# Patient Record
Sex: Male | Born: 1945 | Race: White | Hispanic: No | Marital: Married | Smoking: Never smoker
Health system: Southern US, Community
[De-identification: ages and names within clinical notes are randomized; demographics above are authoritative.]

## PROBLEM LIST (undated history)

## (undated) HISTORY — PX: CORONARY ANGIOPLASTY WITH STENT PLACEMENT: SHX49

---

## 2019-08-21 ENCOUNTER — Emergency Department (HOSPITAL_COMMUNITY)
Admission: EM | Admit: 2019-08-21 | Discharge: 2019-08-21 | Discharge status: Against Medical Advice | Payer: Medicare HMO | Attending: Emergency Medicine | Admitting: Emergency Medicine

## 2019-08-21 ENCOUNTER — Emergency Department (HOSPITAL_COMMUNITY): Payer: Medicare HMO

## 2019-08-21 ENCOUNTER — Other Ambulatory Visit: Payer: Self-pay

## 2019-08-21 ENCOUNTER — Encounter (HOSPITAL_COMMUNITY): Payer: Self-pay | Admitting: Emergency Medicine

## 2019-08-21 DIAGNOSIS — R55 Syncope and collapse: Secondary | ICD-10-CM

## 2019-08-21 LAB — URINALYSIS, ROUTINE W REFLEX MICROSCOPIC
Bilirubin Urine: NEGATIVE
Glucose, UA: NEGATIVE mg/dL
Hgb urine dipstick: NEGATIVE
Ketones, ur: NEGATIVE mg/dL
Leukocytes,Ua: NEGATIVE
Nitrite: NEGATIVE
Protein, ur: NEGATIVE mg/dL
Specific Gravity, Urine: 1.016 (ref 1.005–1.030)
pH: 5 (ref 5.0–8.0)

## 2019-08-21 LAB — TROPONIN I (HIGH SENSITIVITY): Troponin I (High Sensitivity): 26 ng/L — ABNORMAL HIGH (ref <18)

## 2019-08-21 LAB — CBC
HCT: 42.6 % (ref 39.0–52.0)
Hemoglobin: 14.4 g/dL (ref 13.0–17.0)
MCH: 32.2 pg (ref 26.0–34.0)
MCHC: 33.8 g/dL (ref 30.0–36.0)
MCV: 95.3 fL (ref 80.0–100.0)
Platelets: 134 10*3/uL — ABNORMAL LOW (ref 150–400)
RBC: 4.47 MIL/uL (ref 4.22–5.81)
RDW: 12.3 % (ref 11.5–15.5)
WBC: 6.9 10*3/uL (ref 4.0–10.5)
nRBC: 0 % (ref 0.0–0.2)

## 2019-08-21 LAB — BASIC METABOLIC PANEL
Anion gap: 9 (ref 5–15)
BUN: 17 mg/dL (ref 8–23)
CO2: 23 mmol/L (ref 22–32)
Calcium: 9.2 mg/dL (ref 8.9–10.3)
Chloride: 106 mmol/L (ref 98–111)
Creatinine, Ser: 1.04 mg/dL (ref 0.61–1.24)
GFR calc Af Amer: >60 mL/min (ref >60)
GFR calc non Af Amer: >60 mL/min (ref >60)
Glucose, Bld: 133 mg/dL — ABNORMAL HIGH (ref 70–99)
Potassium: 4.5 mmol/L (ref 3.5–5.1)
Sodium: 138 mmol/L (ref 135–145)

## 2019-08-21 LAB — CBG MONITORING, ED: Glucose-Capillary: 82 mg/dL (ref 70–99)

## 2019-08-21 MED ORDER — SODIUM CHLORIDE 0.9% FLUSH: 3.0000 mL | Freq: Once | INTRAVENOUS | Status: DC

## 2019-08-21 NOTE — Discharge Instructions (Addendum)
Return if any problems.  Contact your cardiologist Monday and let them know what happened

## 2019-08-21 NOTE — ED Notes (Signed)
Pt requesting to leave AMA. Dr. Estell Harpin aware. Pt alert and oriented x4. Verbalized understanding.

## 2019-08-21 NOTE — ED Provider Notes (Signed)
[facility] Provider Note   CSN: [id number] Arrival date & time: 08/21/19  1331     History Chief Complaint  Patient presents with  . Loss of Consciousness    Chris Scott is a 74 y.o. male.  The patient states he went for a run today for about 4 miles and rested at home for an hour.  Then he went to the bathroom to take a shower and he passed out.  Patient did not get dizzy and was not in the shower.  Patient has a history of 1 stent 3 years ago and he has a history of a syncopal episode 7 years ago.  Patient has no complaints now  The history is provided by the patient. No language interpreter was used.  Loss of Consciousness Episode history:  Single Most recent episode:  Today Timing:  Unable to specify Progression:  Resolved Chronicity:  New Context: not blood draw   Witnessed: no   Relieved by:  Nothing Worsened by:  Nothing Ineffective treatments:  None tried Associated symptoms: no anxiety, no chest pain, no headaches and no seizures        History reviewed. No pertinent past medical history.  There are no problems to display for this patient.   Past Surgical History:  Procedure Laterality Date  . CORONARY ANGIOPLASTY WITH STENT PLACEMENT         No family history on file.  Social History   Tobacco Use  . Smoking status: Never Smoker  . Smokeless tobacco: Never Used  Substance Use Topics  . Alcohol use: Not Currently  . Drug use: Never    Home Medications Prior to Admission medications   Not on File    Allergies    Patient has no allergy information on record.  Review of Systems   Review of Systems  Constitutional: Negative for appetite change and fatigue.  HENT: Negative for congestion, ear discharge and sinus pressure.   Eyes: Negative for discharge.  Respiratory: Negative for cough.   Cardiovascular: Positive for syncope. Negative for chest pain.       Syncope  Gastrointestinal: Negative for  abdominal pain and diarrhea.  Genitourinary: Negative for frequency and hematuria.  Musculoskeletal: Negative for back pain.  Skin: Negative for rash.  Neurological: Negative for seizures and headaches.  Psychiatric/Behavioral: Negative for hallucinations.    Physical Exam Updated Vital Signs BP (!) 152/73   Pulse (!) 42   Temp 98.1 F (36.7 C) (Oral)   Resp 15   Ht 5\' 9"  (1.753 m)   Wt 65.8 kg   SpO2 97%   BMI 21.41 kg/m   Physical Exam Vitals and nursing note reviewed.  Constitutional:      Appearance: He is well-developed.  HENT:     Head: Normocephalic.     Nose: Nose normal.  Eyes:     General: No scleral icterus.    Conjunctiva/sclera: Conjunctivae normal.  Neck:     Thyroid: No thyromegaly.  Cardiovascular:     Rate and Rhythm: Regular rhythm.     Heart sounds: No murmur. No friction rub. No gallop.      Comments: Bradycardia Pulmonary:     Breath sounds: No stridor. No wheezing or rales.  Chest:     Chest wall: No tenderness.  Abdominal:     General: There is no distension.     Tenderness: There is no abdominal tenderness. There is no rebound.  Musculoskeletal:  General: Normal range of motion.     Cervical back: Neck supple.  Lymphadenopathy:     Cervical: No cervical adenopathy.  Skin:    Findings: No erythema or rash.  Neurological:     Mental Status: He is alert and oriented to person, place, and time.     Motor: No abnormal muscle tone.     Coordination: Coordination normal.  Psychiatric:        Behavior: Behavior normal.     ED Results / Procedures / Treatments   Labs (all labs ordered are listed, but only abnormal results are displayed) Labs Reviewed  BASIC METABOLIC PANEL - Abnormal; Notable for the following components:      Result Value   Glucose, Bld 133 (*)    All other components within normal limits  CBC - Abnormal; Notable for the following components:   Platelets 134 (*)    All other components within normal limits    TROPONIN I (HIGH SENSITIVITY) - Abnormal; Notable for the following components:   Troponin I (High Sensitivity) 26 (*)    All other components within normal limits  URINALYSIS, ROUTINE W REFLEX MICROSCOPIC  CBG MONITORING, ED  TROPONIN I (HIGH SENSITIVITY)    EKG EKG Interpretation  Date/Time:  Saturday August 21 2019 15:36:49 EST Ventricular Rate:  39 PR Interval:  206 QRS Duration: 105 QT Interval:  512 QTC Calculation: 413 R Axis:   74 Text Interpretation: Sinus bradycardia Prolonged PR interval Borderline ST depression, diffuse leads Confirmed by Bethann Berkshire [phone]) on 08/21/2019 3:40:12 PM   Radiology CT Head Wo Contrast  Result Date: 08/21/2019 CLINICAL DATA:  Headache, head trauma. Syncope. EXAM: CT HEAD WITHOUT CONTRAST TECHNIQUE: Contiguous axial images were obtained from the base of the skull through the vertex without intravenous contrast. COMPARISON:  None. FINDINGS: Brain: No intracranial hemorrhage, mass effect, or midline shift. Brain volume is normal for age. No hydrocephalus. The basilar cisterns are patent. Minimal mineralization in the basal gangliar likely senescent. No evidence of territorial infarct or acute ischemia. No extra-axial or intracranial fluid collection. Vascular: Atherosclerosis of skullbase vasculature without hyperdense vessel or abnormal calcification. Skull: Normal. Negative for fracture or focal lesion. Sinuses/Orbits: Assessed on concurrent face CT, reported separately. Other: None. IMPRESSION: Negative head CT. Electronically Signed   By: Narda Rutherford M.D.   On: 08/21/2019 16:31   CT Maxillofacial Wo Contrast  Result Date: 08/21/2019 CLINICAL DATA:  Facial trauma. Right facial bruising. Syncope today. EXAM: CT MAXILLOFACIAL WITHOUT CONTRAST TECHNIQUE: Multidetector CT imaging of the maxillofacial structures was performed. Multiplanar CT image reconstructions were also generated. COMPARISON:  None. FINDINGS: Osseous: Nasal bone, zygomatic  arches, and mandibles are intact. No acute fracture. Minimal leftward nasal septal deviation. Orbits: No acute orbital fracture. Both orbits and globes are intact. Sinuses: No sinus fracture or fluid level. Paranasal sinuses are clear. No mastoid effusion. Soft tissues: Soft tissue edema involving the right side of the face. Limited intracranial: Negative. Assessed on concurrent head CT. IMPRESSION: Soft tissue edema involving the right side of the face. No facial bone fracture. Electronically Signed   By: Narda Rutherford M.D.   On: 08/21/2019 16:34    Procedures Procedures (including critical care time)  Medications Ordered in ED Medications  sodium chloride flush (NS) 0.9 % injection 3 mL (has no administration in time range)    ED Course  I have reviewed the triage vital signs and the nursing notes.  Pertinent labs & imaging results that were available during my care  of the patient were reviewed by me and considered in my medical decision making (see chart for details).    MDM Rules/Calculators/A&P                      EKG shows bradycardia.  Patient states he is always in bradycardia at around 40.  First troponin was mildly elevated.  Rest of labs are unremarkable.  I spoke with cardiology and it was recommended to admit the patient.  The patient stated he did not want to stay.  He understood the risk and left AMA Final Clinical Impression(s) / ED Diagnoses Final diagnoses:  Syncope and collapse    Rx / DC Orders ED Discharge Orders    None       Bethann Berkshire, MD 08/21/19 1905

## 2019-08-21 NOTE — ED Triage Notes (Signed)
Pt ran 4 miles today, came back in and looked at phone for 1 hour, and had syncopal episode when he went to get in shower.  Wife found him in floor and he states per his wife he was only unresponsive for approx 30 seconds.  Pt has mild pain and bruising to R side of face from fall.  Denies any other injuries. Denies dizziness.

## 2019-09-14 NOTE — Progress Notes (Signed)
 "   Medicare Subsequent AWV   Chris Scott is a 74 y.o. male who presents for his subsequent annual wellness visit for Medicare.  Any physical exam components or additional concerns beyond the scope of the Annual Wellness Visit may be documented in a separate note within this encounter.  Health Risk Assessment   Current Living Arrangement: Spouse/Significant Other During the past four weeks, how much pain in your body have you had on a scale of 0-10?: Very mild pain (1-2) During the past four weeks, was someone available to help you if you needed and wanted help?: Yes, as much as I wanted During the past four weeks, what was the hardest level of physical activity you could do for at least two minutes?: Heavy Each night, how many hours of sleep do you usually get?: 7 Do you snore or has anyone told you that you snore?: No Do you always fasten your seat belt when you are in a car?: Yes Do you drive after drinking alcohol or ride with a driver who has been drinking?: No How often during the past four weeks have you been bothered by falling or dizziness when standing up?: Seldom How often during the past four weeks have you been bothered by sexual problems?: Never Do any of the following describe you?  Multiple sex partners and/or intercourse with partner of the same sex: No How often during the past four weeks have you been bothered by teeth or denture problems?: Never How often during the past four weeks have you been bothered by tiredness or fatigue?: (!) Sometimes During the past four weeks, how would you rate your health in general?: Very good What is your race? (Check all that apply): White   Depression Screening   Depression Screen 09/14/2019 08/05/2019 07/14/2017  Please choose the category that best describes the patient's current state 0 0 0  Not eligible on the basis of Not applicable Not applicable Not applicable  1. Little interest or pleasure in doing things 0 0 0  2. Feeling  down, depressed, or hopeless 0 0 0  PHQ-2 Total Score 0 0 0  PHQ-2 Positive/Negative Screening for Depression Negative Negative -  Some recent data might be hidden     Cognitive and Functional Assessments   Is the person deaf or does he/she have serious difficulty hearing?: No Is this person blind or does he/she have serious difficulty seeing even when wearing glasses?: No Do you/patient have serious difficulty concentrating, remembering, or making decisions?: No Have you had any concerns about changes in your memory or concentration?: No  ADL Assessment   Please select any of the following that the person has serious difficulty managing on their own:: none apply Please select any of the following that the person has serious difficulty managing on their own:: none apply   Social Determinants of Health (SDoH) and Personal Data   On average, how many days per week do you engage in moderate to strenuous exercise (like a brisk walk)?: 6 days On average, how many minutes do you engage in exercise at this level?: 50 min How hard is it for you to pay for the very basics like food, housing, medical care, and heating?: Not hard at all In the last 12 months, was there a time when you were not able to pay the mortgage or rent on time?: Yes In the last 12 months, how many places have you lived?: 1 In the last 12 months, was there a time  when you did not have a steady place to sleep or slept in a shelter (including now)?: No In the past 12 months, has lack of transportation kept you from medical appointments or from getting medications?: No In the past 12 months, has lack of transportation kept you from meetings, work, or from getting things needed for daily living?: No Within the past 12 months, you worried that your food would run out before you got the money to buy more.: Never true Within the past 12 months, the food you bought just didnt last and you didnt have money to get more.: Never  true Do you feel stress - tense, restless, nervous, or anxious, or unable to sleep at night because your mind is troubled all the time - these days?: Only a little In a typical week, how many times do you talk on the phone with family, friends, or neighbors?: Once a week How often do you get together with friends or relatives?: Once a week How often do you attend church or religious services?: More than 4 times per year Do you belong to any clubs or organizations such as church groups, unions, fraternal or athletic groups, or school groups?: Yes How often do you attend meetings of the clubs or organizations you belong to?: Never Are you married, widowed, divorced, separated, never married, or living with a partner?: Married How often do you have a drink containing alcohol?: Monthly or less How many drinks containing alcohol do you have on a typical day when you are drinking?: 1 or 2 How often do you have six or more drinks on one occasion?: Never Do you depend on people living with you for personal care?: No Are there any cultural or religious beliefs that your healthcare provider should be aware of that would be helpful in your health care? : No  Advance Care Directives   Do you have a living will?: Yes Do you have a Healthcare Power of Attorney?: Yes Who is your Healthcare Power of Attorney?: wife Chris Scott Risk Screening   Fall Risk Category: High Risk   Patient can ambulate: Yes In the last year, have you had 2 or more falls, trips or stumbles where you landed on the ground?: No In the last year, have you had 1 or more falls, trips or stumbles where you hurt yourself?: (!) Yes Do you feel you could benefit from installing grab bars on your tub and/or shower?: No Do you use scatter rugs throughout your home?: (!) Yes Get dizzy or lightheaded when you change positions?: (!) Yes Problems walking for any reason?: No Vision Problems?: (!) Yes Do you use anything or anyone to help  you walk?: No  Intervention: Lifestyle modifications and Medications reviewed and/or modified  Medicare Required Components    Social History section has been updated:: Yes(no changes) The following additional chart sections were updated:: Past Medical History, Past Surgical History, Medications, Allergies, Family History(no changes)  Cognitive screen indicated?: No Based on my direct observation, with due consideration of information obtained via beneficiary reports and communication with family members/care takers, further cognitive assessment is not indicated.  Dietary issues addressed:: No HRA completed and reviewed:: Yes Care Team updated:: Yes(no changes) Advance care directives discussed and information provided if necessary:: No  Patient Care Team: [name], MD as PCP - General (Family Medicine) Devona Frutoso Hagedorn, MD as Consulting Physician (Cardiovascular Disease)  Vitals    Vitals:   09/14/19 1519  BP: 132/84  Patient Position:  Sitting  Pulse: (!) 49  Temp: 97.2 F (36.2 C)  TempSrc: Skin  Height: 5' 9 (1.753 m)  Weight: 151 lb 9.6 oz (68.8 kg)  SpO2: 98%  BMI (Calculated): 22.4    Disposition   1. Medicare annual wellness visit, subsequent (Primary) 2. Annual physical exam 3. Mixed hyperlipidemia 4. Coronary artery disease involving native coronary artery of native heart without angina pectoris 5. Sinus bradycardia 6. Colon cancer screening -     Cologuard Stool    Follow up in about 1 year (around 09/13/2020) for Annual wellness visit.   Health maintenance issues including appropriate cancer screening, annual eye exam, healthy diet, exercise and tobacco avoidance were discussed with the patient.  A written plan was provided to the patient in the form of patient instructions in the after visit summary.   "

## 2019-09-24 NOTE — Nursing Note (Signed)
 Pt and wife provided written and verbal instructions including NPO status after MN on 09/28/19, medications to take/hold the morning of the procedure, fall precautions related to anesthesia, infections prevention including bathing x2 with CHG soap the night prior to and the morning of the procedure and arrival time of 0830 on 10/02/18. Surgical prep with clippers done. All questions answered and both voiced understanding to all instructions.

## 2019-09-24 NOTE — H&P (Signed)
 ------------------------------------------------------------------------------- Attestation signed by Sherron DELENA Champ, MD at 09/27/19 1201 I certify that I have reviewed the above note entered by the PA/NP/student.  I have participated in the formulation of the plan and have reviewed the anticipated medical care.  I agree with the decision making and the care plans as indicated in the note.  -------------------------------------------------------------------------------   PREP FOR CASE - I have not met nor completed a physical exam for this patient.  I have not developed a treatment plan nor made recommendations for this patient's procedure.  Sherron DELENA Champ, MD  Physician  Specialty:  Cardiology  Progress Notes     Signed  Encounter Date:  09/07/2019          Signed                                    Referring Provider No att. providers found   Reason for consult:  1. Sinus bradycardia   2. Coronary artery disease involving native coronary artery of native heart without angina pectoris   3. Hearing loss, unspecified hearing loss type, unspecified laterality       Chronic Problem List:     Patient Active Problem List  Diagnosis   Shoulder pain, left   Coronary artery disease involving native coronary artery of native heart without angina pectoris   Hearing loss   Sinus bradycardia   Mixed hyperlipidemia      History of Present Illness Chris Scott is 74 y.o. year old male with a history of coronary artery disease status post stenting who is also a long-term runner who presents to [facility] health cardiology EP clinic for the evaluation of syncope. This patient reports that for syncopal episode approximately 7 years ago and had none until several weeks ago. He has had 2 syncopal events that occur seemingly the same way. He continues to run and ran for about 40 minutes. After running he felt well relaxed and sat down in a chair and then upon  rising felt quite dizzy and lightheaded and found himself on the floor. The the other episode was quite similar and was post exercise. A Holter monitor was applied and it demonstrates a slowest heart rate of 28 bpm during sleep. He also has some profound sinus bradycardia with rates less than 40 bpm at 9:00 in the morning. 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 Given his prior to syncopal episodes and bradycardia that has been monitored on the Holter I think it seems reasonable to place a pacemaker. I reassured him that in the long-term after the pacemaker is healed he can return to exercise. He should avoid running and driving an automobile until such time the decision about the device is made. He will complete his stress echocardiogram to exclude recurrent coronary disease before the pacemaker is placed.   Past Medical History     Past Medical History:  Diagnosis Date   Coronary artery disease        Past Surgical History      Past Surgical History:  Procedure Laterality Date   Cardiac surgery       Elbow surgery       Tonsillectomy          Social History Patient's  Family History Patient's family history includes Arrhythmia in his mother and sister; Arthritis in his brother; Diabetes in his father and mother; Heart disease in his mother; Hypertension in his mother.   Allergies Patient has no known allergies.   Medications   Current Outpatient Medications:    aspirin 81 mg tablet, Take 81 mg by mouth daily., Disp: , Rfl:    atorvastatin (LIPITOR) 80 mg tablet, TAKE 1 TABLET BY MOUTH EVERY DAY, Disp: 90 tablet, Rfl: 3   clopidogrel bisulfate (PLAVIX) 75 mg tablet, Take one tablet (75 mg dose) by mouth daily., Disp: 90 tablet, Rfl: 1   Review of Systems A comprehensive review of systems was  negative.   Physical Examination Vitals: Blood pressure 122/80, pulse (!) 41, temperature 97.9 F (36.6 C), resp. rate 16, height 5' 9 (1.753 m), weight 148 lb (67.1 kg), SpO2 98 %. General:  Pleasant, cooperative, and in no acute distress.  Alert and oriented. HEENT:  Sclera clear, anicteric.  No thyromegaly or lymphadenopathy noted. Cardiovascular:  S1, S2 normal, no murmur, rub or gallop, regular rate and rhythm. Carotid upstokes are normal and without bruits.  The peripheral pulses are palpable and symmetric. Lungs: chest clear, no wheezing, rales, normal symmetric air entry, Heart exam - S1, S2 normal, no murmur, no gallop, rate regular Abdomen:  abdomen is soft without significant tenderness, masses, organomegaly or guarding.  No masses, bruits, or hepatosplenomegaly noted. Skin:  Normal texture with no significant lesions. Extremities:  No significant clubbing or cyanosis. No  edema Psychiatric:  Normal affect.  Mood within normal limits. Neurologic:  Grossly nonfocal.   Accessory Clinical Data        Lab Results  Component Value Date    Glucose 93 07/23/2018    CALCIUM 9.8 07/23/2018    Sodium 146 (H) 07/23/2018    Potassium 4.5 08/06/2019    CO2 23 07/23/2018    Chloride 108 (H) 07/23/2018    BUN 19 07/23/2018    Creatinine 1.10 07/23/2018         Lab Results  Component Value Date    WBC 7.0 07/21/2018    Hemoglobin 15.1 07/21/2018    Hematocrit 44.7 07/21/2018    MCV 95 07/21/2018    Platelet Count 168 07/21/2018         Lab Results  Component Value Date    CK-MB 3.2 04/14/2012    CK 91 04/14/2012    Troponin T <0.010 04/14/2012    No results found for: INR, PROTIME   Impression History of coronary artery disease with remote stenting Recurrent syncope x2 Documented sinus node dysfunction with profound sinus bradycardia         Plan I believe he meets qualifications for dual-chamber pacemaker placement All the risk associated with with the device  placement were discussed with the patient and his wife in detail they will think about it and let us  know their decision He will complete his stress echocardiogram         Thank you for allowing us  to participate in the care of this patient.              Office Visit on 09/07/2019 <redacted file path>   Detailed Report <redacted file path>  Note shared with patient

## 2019-09-24 NOTE — ACP (Advance Care Planning) (Signed)
 [facility]  Advance Care Planning   Patient:   Chris Scott MR Number:  [id number] Patient Date of Birth: May 12, 1946 Age/Sex:  74 y.o./male      Discussion Date: 09/24/2019 Discussion Focus: Goals of Care  Health Care Agents    Fransisco, Messmer - Spouse Not Active  Primary Phone: [phone] (Mobile)         Is agent appointed in legal document?: Yes  Discussion Participants: Patient  Summary of voluntary conversation : requested copies of advance directives.   Patient's Expressed Goals of Health Care/Life Goals: Improve maintain function/quality of life  Electronically signed: Shanda Holmes, RN 09/24/2019 / 1:35 PM

## 2019-09-28 NOTE — H&P (Signed)
 [name] 09/07/2019 2:30 PM  Office Visit MRN:  [id number] Description: Male DOB: [birth year] Provider: Sherron DELENA Champ, MD Department: [facility] Encounter #: [id number]  Referring Provider  [name], MD      Diagnoses  Sinus bradycardia - Primary  Codes: R00.1  Coronary artery disease involving native coronary artery of native heart without angina pectoris   Codes: I25.10  Hearing loss, unspecified hearing loss type, unspecified laterality   Codes: H91.90       Reason for Visit  Reason for Visit History <redacted file path>    Progress Notes  Sherron DELENA Champ, MD at 09/07/2019 3:17 PM  Status: Signed    Referring Provider No att. providers found  Reason for consult:  1. Sinus bradycardia   2. Coronary artery disease involving native coronary artery of native heart without angina pectoris   3. 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 year old male with a history of coronary artery disease status post stenting who is also a long-term runner who presents to [facility] health cardiology EP clinic for the evaluation of syncope. This patient reports that for syncopal episode approximately 7 years ago and had none until several weeks ago. He has had 2 syncopal events that occur seemingly the same way. He continues to run and ran for about 40 minutes. After running he felt well relaxed and sat down in a chair and then upon rising felt quite dizzy and lightheaded and found himself on the floor. The the other episode was quite similar and was post exercise. A Holter monitor was applied  and it demonstrates a slowest heart rate of 28 bpm during sleep. He also has some profound sinus bradycardia with rates less than 40 bpm at 9:00 in the morning. 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 Given his prior to syncopal episodes and bradycardia that has been monitored on the Holter I think it seems reasonable to place a pacemaker. I reassured him that in the long-term after the pacemaker is healed he can return to exercise. He should avoid running and driving an automobile until such time the decision about the device is made. He will complete his stress echocardiogram to exclude recurrent coronary disease before the pacemaker is placed.  Past Medical History     Past Medical History:  Diagnosis Date   Coronary artery disease     Past Surgical History      Past Surgical History:  Procedure Laterality Date   Cardiac surgery     Elbow surgery     Tonsillectomy      Social History Patient's   Family History Patient's family history includes Arrhythmia in his mother and sister; Arthritis in his brother; Diabetes in his father and mother; Heart disease in his mother; Hypertension in his mother.  Allergies Patient has no known allergies.  Medications  Current Outpatient Medications:    aspirin 81 mg tablet, Take  81 mg by mouth daily., Disp: , Rfl:    atorvastatin (LIPITOR) 80 mg tablet, TAKE 1 TABLET BY MOUTH EVERY DAY, Disp: 90 tablet, Rfl: 3   clopidogrel bisulfate (PLAVIX) 75 mg tablet, Take one tablet (75 mg dose) by mouth daily., Disp: 90 tablet, Rfl: 1  Review of Systems A comprehensive review of systems was negative.  Physical Examination Vitals: Blood pressure 122/80, pulse (!) 41, temperature 97.9 F (36.6 C), resp. rate 16, height 5' 9 (1.753 m), weight 148 lb  (67.1 kg), SpO2 98 %. General:  Pleasant, cooperative, and in no acute distress.  Alert and oriented. HEENT:  Sclera clear, anicteric.  No thyromegaly or lymphadenopathy noted. Cardiovascular:  S1, S2 normal, no murmur, rub or gallop, regular rate and rhythm. Carotid upstokes are normal and without bruits.  The peripheral pulses are palpable and symmetric. Lungs: chest clear, no wheezing, rales, normal symmetric air entry, Heart exam - S1, S2 normal, no murmur, no gallop, rate regular Abdomen:  abdomen is soft without significant tenderness, masses, organomegaly or guarding.  No masses, bruits, or hepatosplenomegaly noted. Skin:  Normal texture with no significant lesions. Extremities:  No significant clubbing or cyanosis. No  edema Psychiatric:  Normal affect.  Mood within normal limits. Neurologic:  Grossly nonfocal.  Accessory Clinical Data       Lab Results  Component Value Date   Glucose 93 07/23/2018   CALCIUM 9.8 07/23/2018   Sodium 146 (H) 07/23/2018   Potassium 4.5 08/06/2019   CO2 23 07/23/2018   Chloride 108 (H) 07/23/2018   BUN 19 07/23/2018   Creatinine 1.10 07/23/2018        Lab Results  Component Value Date   WBC 7.0 07/21/2018   Hemoglobin 15.1 07/21/2018   Hematocrit 44.7 07/21/2018   MCV 95 07/21/2018   Platelet Count 168 07/21/2018        Lab Results  Component Value Date   CK-MB 3.2 04/14/2012   CK 91 04/14/2012   Troponin T <0.010 04/14/2012   No results found for: INR, PROTIME  Impression History of coronary artery disease with remote stenting Recurrent syncope x2 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       Electronically signed by Sherron DELENA Champ, MD at 09/07/2019 3:21 PM  Questionnaires  No completed forms available for this encounter.  Vital Signs Most recent update: 09/07/2019 2:46 PM BP  122/80     Pulse  41      Temp  97.9 F (36.6 C)     Resp  16     Ht  5' 9 (1.753 m)      Wt  148 lb (67.1 kg)   SpO2  98%   BMI  21.86 kg/m    Orders Placed This Encounter

## 2019-09-29 NOTE — Interval H&P Note (Signed)
 H&P reviewed, patient examined and there is NO CHANGE to the patient status.

## 2019-09-29 NOTE — Discharge Summary (Signed)
 ------------------------------------------------------------------------------- Attestation signed by Sherron DELENA Champ, MD at 09/30/19 1309 I certify that I have reviewed the above note entered by the PA/NP/student.  I have participated in the formulation of the plan and have reviewed the anticipated medical care.  I agree with the decision making and the care plans as indicated in the note.  -------------------------------------------------------------------------------  [facility] Discharge Summary  PCP: Chris JAYSON Reilly, MD Discharge Details   Admit date:         09/29/2019 Discharge date:        09/30/2019   Active Hospital Problems   Diagnosis Date Noted POA   *Sinus bradycardia 08/05/2019 Unknown    Resolved Hospital Problems  No resolved problems to display.    Current Discharge Medication List    CONTINUED medications   Details  aspirin 81 mg tablet Take 81 mg by mouth daily.    atorvastatin (LIPITOR) 80 mg tablet TAKE 1 TABLET BY MOUTH EVERY DAY    clopidogrel bisulfate (PLAVIX) 75 mg tablet Take one tablet (75 mg dose) by mouth daily.          Hospital Course  Physicians involved in care during this hospitalization Attending Provider: Sherron DELENA Champ, MD Admitting Provider: Sherron DELENA Champ, MD Anesthesiologist: Marius Marius Farr, MD  Indication for Admission:   [facility] Course:    Chris Scott is a 74 yo male presented with multiple syncopal events and found to have profound bradycardia after wearing a Holter monitor revealing rates less than 40 bpm in the mornings.  Patient presented as an OPD to undergo PPM implantation for SSS.    Had a successful left sided dual chamber PPM on 09/29/2019.  Incision site w/o hematoma. CXR with stable lead placement and device interrogation revealed a properly functioning device.    Patient discharged in stable condition.  Vitals signs stable and remaining physical examination unremarkable.    Discharge instructions were reviewed at bedside, questions were answered to his satisfaction and had no further questions upon my departure.    Follow up with nurse in one week to assess wound and 6 week device interrogation in 6 weeks.  Maintain FU appt as scheduled below with primary electrophysiologist.     Procedure(s) (LRB): EP Pacemaker Insertion (Dual Chamber) (Left)  09/29/2019  Surgeon(s): Sherron DELENA Champ, MD -------------------  [facility]   Diet Instructions    Regular Diet       Other Instructions    Ambulatory referral to Cardiology     Reason for referral: FU in [place] for a 1 week nurse assessment of wound device implant   Referral Type: Consultation   Evaluate and Return   Notify Physician for increased pain at the operative site (or unrelieved pain)       Contact information for follow-up          Ambulatory referral to Cardiology      Next Steps: Follow up   Instructions: FU in [place] for a 1 week nurse assessment of wound device implant   Chris JAYSON Reilly, MD  Specialty: Family Medicine  Relationship: PCP - General   [address] [place] [address]  Phone: [phone]     Next Steps: Follow up      Appointments which have been scheduled   Oct 19, 2019  2:15 PM Follow Up Appointment with Sherron DELENA Champ, MD [facility] (--) [address] [place] [address] [phone]  Apr 05, 2020  9:00 AM Office Visit with Chris DELENA Piety, NP [facility] (--) [address] [place] [address] [phone]      Time spent in discharge process:   45 minutes   Electronically signed: Corean DELENA Savannah, NP 09/30/2019 / 10:45 AM

## 2019-09-29 NOTE — Anesthesia Postprocedure Evaluation (Signed)
" °  Patient: Chris Scott Procedure(s): EP Pacemaker Insertion (Dual Chamber) Anesthesia type: general  Patient location:  PACU Patient participation:  Patient able to participate in this evaluation at age appropriate level.  Vital signs reviewed and can be found in nursing documentation.   Post vital signs:   stable Level of consciousness:   awake, alert and oriented  Post-anesthesia pain:   adequate analgesia Airway patency:   patent Respiratory:   unassisted, respiration function adequate, spontaneous ventilation Cardiovascular:   stable, blood pressure acceptable and heart rate acceptable Hydration:   adequate hydration Temperature: temperature adequate >96.32F PONV:  nausea and vomiting controlled Regional anesthesia: no block performed  Anesthetic complications:   no "

## 2019-09-29 NOTE — Nursing Note (Addendum)
 Pt received to room 6145. Hr 69. Sinus rhythm, 1 st degree heart block. A paced. wife at bedside. Incision to lt shoulder clean dry and intact with skin glue and sling in place.

## 2019-09-30 NOTE — Nursing Note (Signed)
 Discharge instructions have been reviewed with the patient/support person. The patient/support person has been provided a copy of the discharge instructions.  The patient/support person has been given the opportunity for a demonstration of specific follow-up care tasks. RN provided education on pacemaker. Pt will be discharged to discharge lounge, wife present and will be taking pt home.

## 2019-09-30 NOTE — Nursing Note (Signed)
 AVS, discharge instructions , S&S of infection, and medications reviewed with pt he voiced understanding denied questions or  Concerns AVS returned to pt after review  To discharge bay C via WC with nurse. Transported home in private vehicle with wife @ 1145

## 2019-10-08 NOTE — Progress Notes (Signed)
 Vitals:   10/08/19 0950  BP: (!) 146/92  Pulse: 77  Temp: 97.9 F (36.6 C)  SpO2: 97%     Pt seen today for initial assessment of wound post pacemaker placement to left chest wall.  No redness or drainage. Patient does have some swelling and bruising. Dermabond intact.  Discharge instructions and S&S of infection reviewed with pt.    Questions were answered and pt voiced understanding. Patient to return for f/u and device check in 2-3 weeks with Dr. Lilian.

## 2019-10-23 LAB — COLOGUARD: COLOGUARD: NEGATIVE

## 2020-10-29 IMAGING — CT CT MAXILLOFACIAL W/O CM
4 of 8 series · 16 of 47 positions shown, 19 images · non-contrast
Comparison: None.

CLINICAL DATA: Facial trauma. Right facial bruising. Syncope today.

EXAM:
CT MAXILLOFACIAL WITHOUT CONTRAST
TECHNIQUE: Multidetector CT imaging of the maxillofacial structures was
performed. Multiplanar CT image reconstructions were also generated.

[Series 4: st thins (person_name) · axial · 0.37mm/px · z∈[-203,-74]mm · 9 of 231 slices shown, 12 images]
[im 24/231  brain]
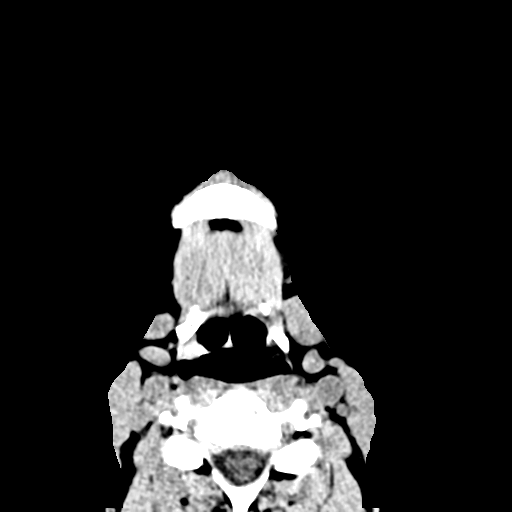
[im 24/231  bone]
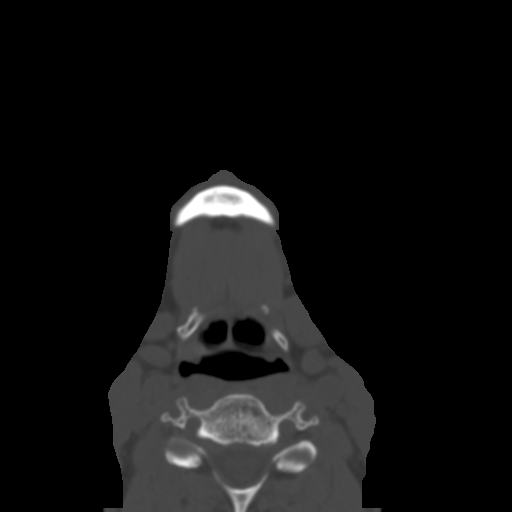
[im 47/231  bone]
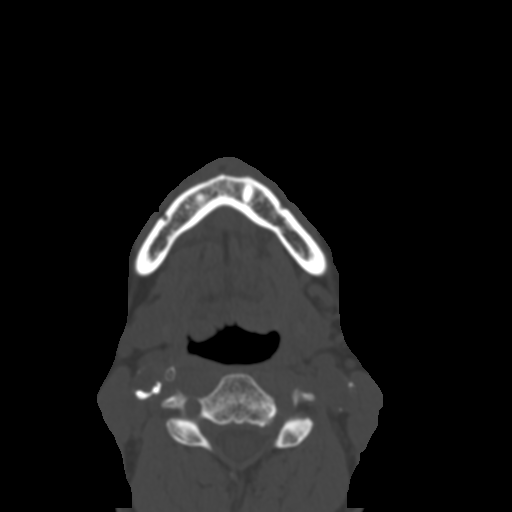
[im 70/231  bone]
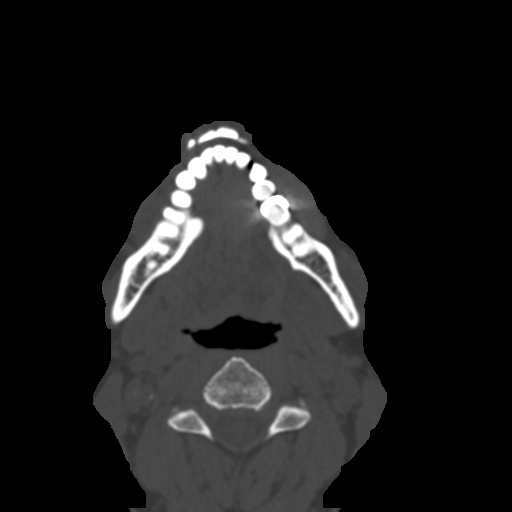
[im 93/231  bone]
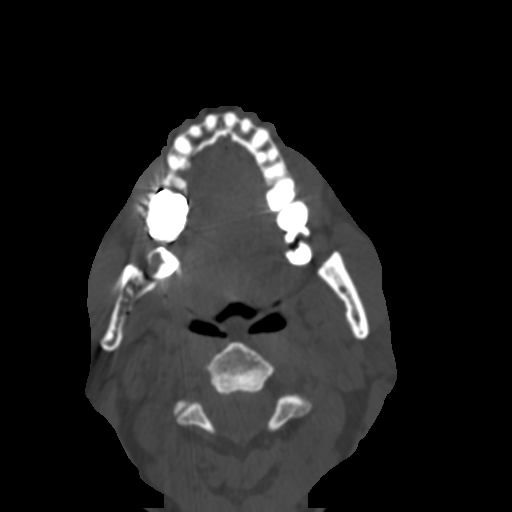
[im 116/231  brain]
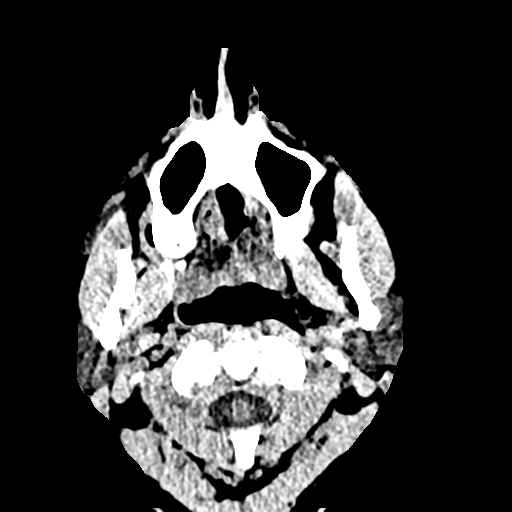
[im 116/231  bone]
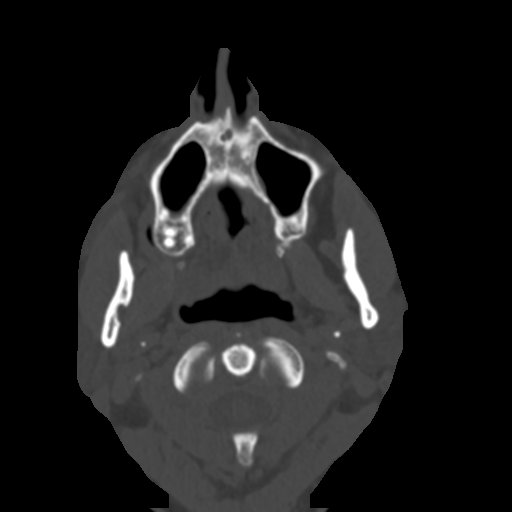
[im 139/231  bone]
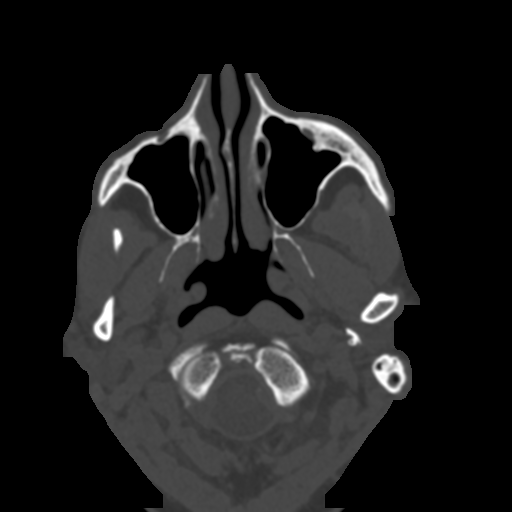
[im 162/231  bone]
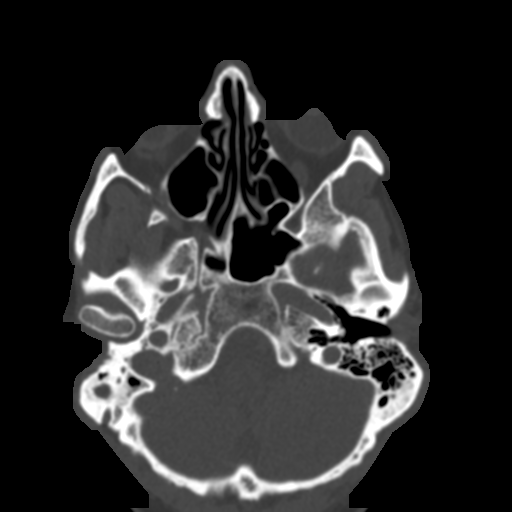
[im 185/231  bone]
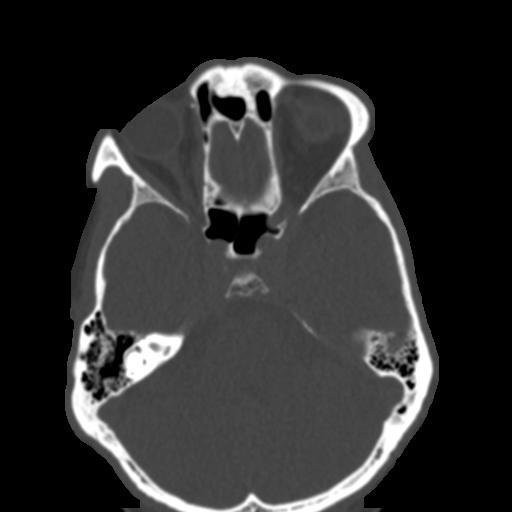
[im 208/231  brain]
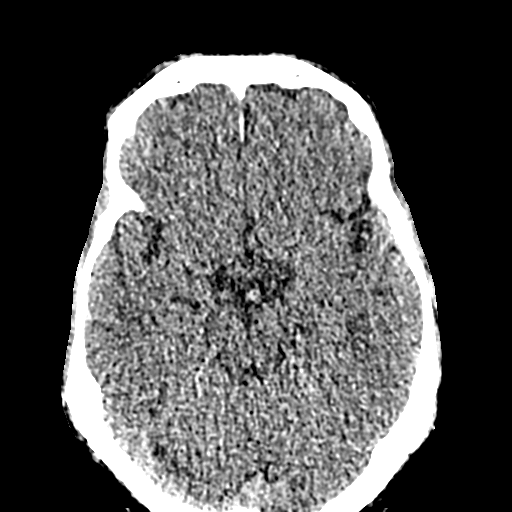
[im 208/231  bone]
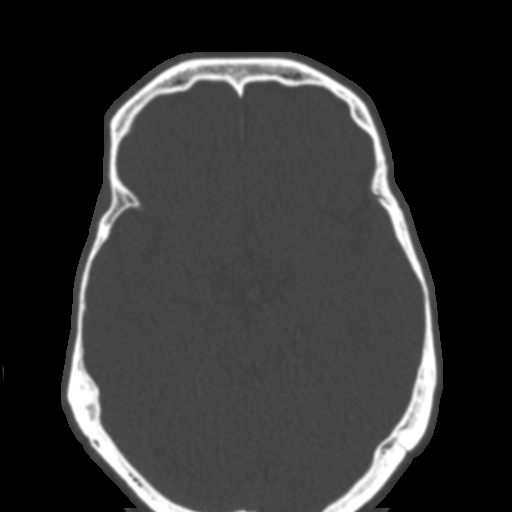

[Series 6: bone thins (person_name) · axial · 0.37mm/px · z∈[-203,-187]mm · 2 of 231 slices shown]
[im 24/231  bone]
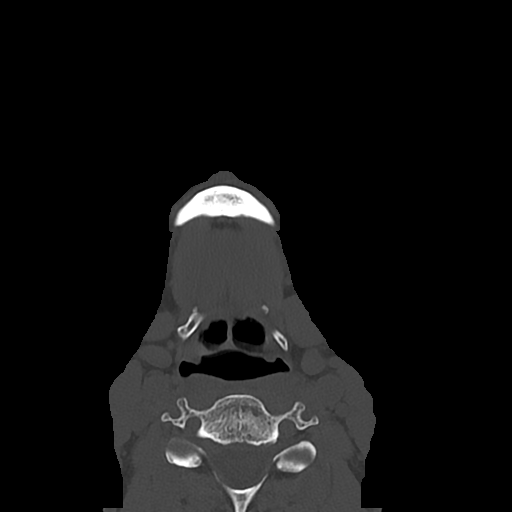
[im 47/231  bone]
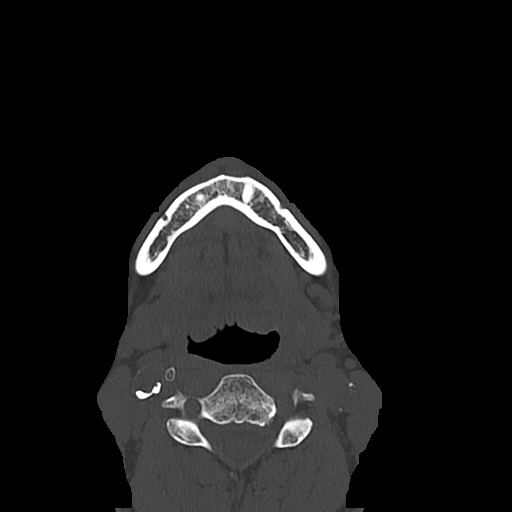

[Series 7: (person_name) · coronal · 0.32mm/px · 3 of 89 slices shown]
[im 23/89  bone]
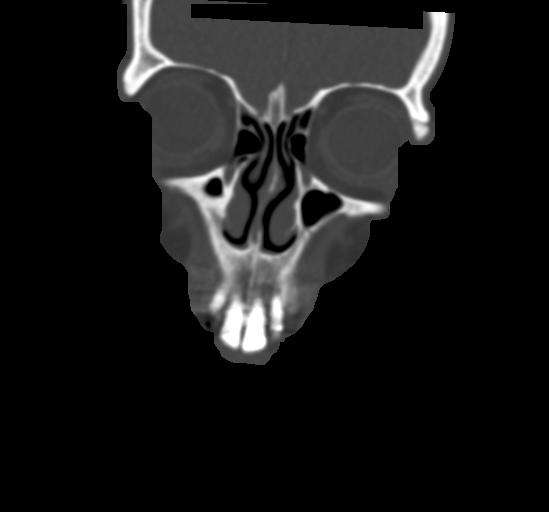
[im 45/89  bone]
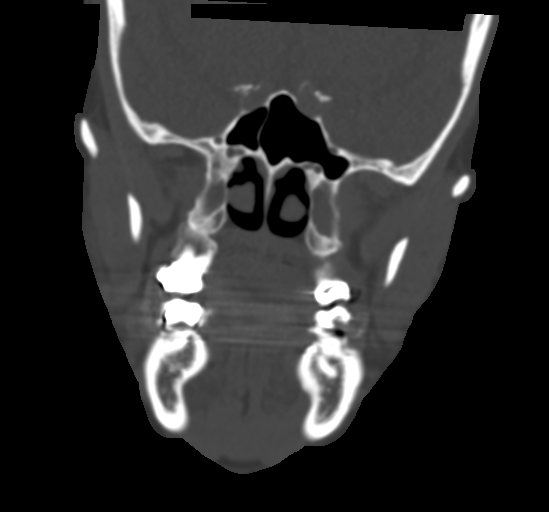
[im 67/89  bone]
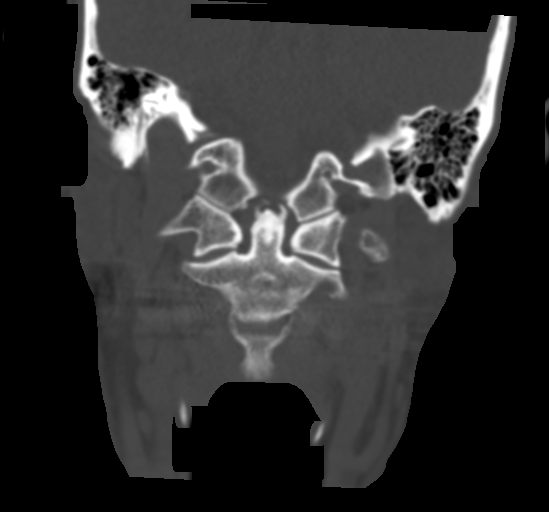

[Series 10: bone sag (person_name) · sagittal · 0.32mm/px · 2 of 89 slices shown]
[im 30/89  bone]
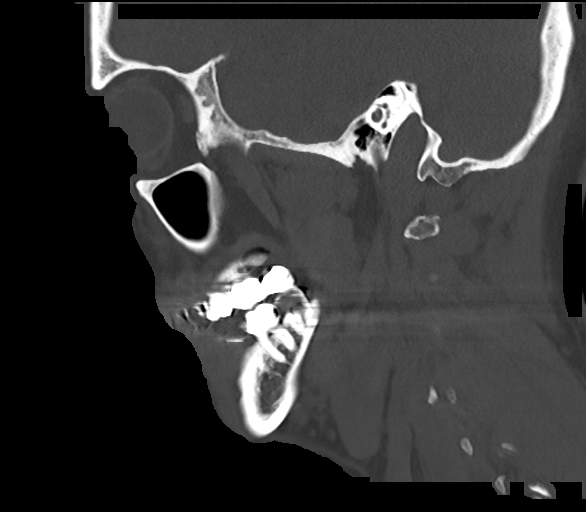
[im 59/89  bone]
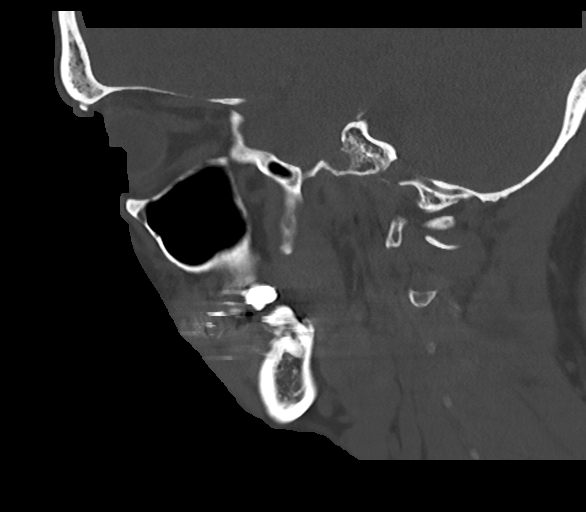

[16 of 47 positions shown; findings below may reference images not displayed]

FINDINGS: Osseous: Nasal bone, zygomatic arches, and mandibles are intact. No
acute fracture. Minimal leftward nasal septal deviation.

Orbits: No acute orbital fracture. Both orbits and globes are
intact.

Sinuses: No sinus fracture or fluid level. Paranasal sinuses are
clear. No mastoid effusion.

Soft tissues: Soft tissue edema involving the right side of the
face.

Limited intracranial: Negative. Assessed on concurrent head CT.
IMPRESSION: Soft tissue edema involving the right side of the face. No facial
bone fracture.

## 2022-12-03 LAB — COLOGUARD: COLOGUARD: NEGATIVE

## 2024-07-08 ENCOUNTER — Emergency Department (HOSPITAL_COMMUNITY)
Admission: EM | Admit: 2024-07-08 | Discharge: 2024-07-08 | Disposition: A | Discharge status: Home / Self Care | Attending: Emergency Medicine | Admitting: Emergency Medicine

## 2024-07-08 ENCOUNTER — Encounter (HOSPITAL_COMMUNITY): Payer: Self-pay

## 2024-07-08 ENCOUNTER — Emergency Department (HOSPITAL_COMMUNITY)

## 2024-07-08 ENCOUNTER — Other Ambulatory Visit: Payer: Self-pay

## 2024-07-08 DIAGNOSIS — R55 Syncope and collapse: Secondary | ICD-10-CM

## 2024-07-08 LAB — CBC WITH DIFFERENTIAL/PLATELET
Abs Immature Granulocytes: 0.03 10*3/uL (ref 0.00–0.07)
Basophils Absolute: 0 10*3/uL (ref 0.0–0.1)
Basophils Relative: 1 %
Eosinophils Absolute: 0.1 10*3/uL (ref 0.0–0.5)
Eosinophils Relative: 1 %
HCT: 34.3 % — ABNORMAL LOW (ref 39.0–52.0)
Hemoglobin: 12.1 g/dL — ABNORMAL LOW (ref 13.0–17.0)
Immature Granulocytes: 0 %
Lymphocytes Relative: 13 %
Lymphs Abs: 1.1 10*3/uL (ref 0.7–4.0)
MCH: 34 pg (ref 26.0–34.0)
MCHC: 35.3 g/dL (ref 30.0–36.0)
MCV: 96.3 fL (ref 80.0–100.0)
Monocytes Absolute: 1.1 10*3/uL — ABNORMAL HIGH (ref 0.1–1.0)
Monocytes Relative: 13 %
Neutro Abs: 6.2 10*3/uL (ref 1.7–7.7)
Neutrophils Relative %: 72 %
Platelets: 121 10*3/uL — ABNORMAL LOW (ref 150–400)
RBC: 3.56 MIL/uL — ABNORMAL LOW (ref 4.22–5.81)
RDW: 13.4 % (ref 11.5–15.5)
WBC: 8.5 10*3/uL (ref 4.0–10.5)
nRBC: 0 % (ref 0.0–0.2)

## 2024-07-08 LAB — COMPREHENSIVE METABOLIC PANEL WITH GFR
ALT: 48 U/L — ABNORMAL HIGH (ref 0–44)
AST: 44 U/L — ABNORMAL HIGH (ref 15–41)
Albumin: 3.2 g/dL — ABNORMAL LOW (ref 3.5–5.0)
Alkaline Phosphatase: 118 U/L (ref 38–126)
Anion gap: 9 (ref 5–15)
BUN: 21 mg/dL (ref 8–23)
CO2: 21 mmol/L — ABNORMAL LOW (ref 22–32)
Calcium: 9.5 mg/dL (ref 8.9–10.3)
Chloride: 108 mmol/L (ref 98–111)
Creatinine, Ser: 1.41 mg/dL — ABNORMAL HIGH (ref 0.61–1.24)
GFR, Estimated: 51 mL/min — ABNORMAL LOW
Glucose, Bld: 196 mg/dL — ABNORMAL HIGH (ref 70–99)
Potassium: 4.5 mmol/L (ref 3.5–5.1)
Sodium: 138 mmol/L (ref 135–145)
Total Bilirubin: 2 mg/dL — ABNORMAL HIGH (ref 0.0–1.2)
Total Protein: 7.4 g/dL (ref 6.5–8.1)

## 2024-07-08 LAB — CBG MONITORING, ED: Glucose-Capillary: 175 mg/dL — ABNORMAL HIGH (ref 70–99)

## 2024-07-08 LAB — PROTIME-INR
INR: 1.2 (ref 0.8–1.2)
Prothrombin Time: 15.5 s — ABNORMAL HIGH (ref 11.4–15.2)

## 2024-07-08 MED ORDER — LIDOCAINE-EPINEPHRINE (PF) 2 %-1:200000 IJ SOLN
10.0000 mL | Freq: Once | INTRAMUSCULAR | Status: AC
  Administered 2024-07-08: 10 mL via INTRADERMAL
  Filled 2024-07-08: qty 20

## 2024-07-08 MED ORDER — SODIUM CHLORIDE 0.9 % IV BOLUS
1000.0000 mL | Freq: Once | INTRAVENOUS | Status: AC
  Administered 2024-07-08: 1000 mL via INTRAVENOUS

## 2024-07-08 NOTE — ED Triage Notes (Signed)
 Pt BIB EMS from home after a fall/syncopal episode has a lac to his chin and an abrasion on top of scalp. Pt wife found him down and did not see fall so unclear what he hit two points of his head on. Pt has hx of syncopal episodes and had a pacemaker placed to help, on Plavix for that and a stent placement. Pt AOX4 and HoH at baseline.

## 2024-07-08 NOTE — Discharge Instructions (Signed)
 Your laboratory results are within normal limits today.  Please follow-up with your primary care physician as needed.  You had 3 stitches placed to your chin, you will need to have these removed within 5 to 7 days.

## 2024-07-08 NOTE — ED Notes (Signed)
 X-ray at bedside

## 2024-07-08 NOTE — ED Notes (Signed)
 Patient transported to CT
